# Patient Record
Sex: Male | Born: 1995 | Race: White | Hispanic: No | Marital: Single | Smoking: Never smoker
Health system: Southern US, Community
[De-identification: ages and names within clinical notes are randomized; demographics above are authoritative.]

## PROBLEM LIST (undated history)

## (undated) DIAGNOSIS — G2581 Restless legs syndrome: Secondary | ICD-10-CM

## (undated) HISTORY — DX: Restless legs syndrome: G25.81

## (undated) HISTORY — PX: HYPOSPADIAS CORRECTION: SHX483

## (undated) HISTORY — PX: CYST EXCISION: SHX5701

---
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
## 2018-12-30 ENCOUNTER — Emergency Department
Admission: EM | Admit: 2018-12-30 | Discharge: 2018-12-30 | Disposition: A | Discharge status: Home / Self Care | Payer: BLUE CROSS/BLUE SHIELD | Source: Home / Self Care | Attending: Family Medicine | Admitting: Family Medicine

## 2018-12-30 ENCOUNTER — Other Ambulatory Visit: Payer: Self-pay

## 2018-12-30 ENCOUNTER — Emergency Department (INDEPENDENT_AMBULATORY_CARE_PROVIDER_SITE_OTHER): Payer: BLUE CROSS/BLUE SHIELD

## 2018-12-30 DIAGNOSIS — K5900 Constipation, unspecified: Secondary | ICD-10-CM | POA: Diagnosis not present

## 2018-12-30 MED ORDER — POLYETHYLENE GLYCOL 3350 17 G PO PACK: 17.0000 g | PACK | Freq: Every day | ORAL | 0 refills | Status: DC

## 2018-12-30 MED ORDER — DOCUSATE SODIUM 100 MG PO CAPS: 100.0000 mg | ORAL_CAPSULE | Freq: Every day | ORAL | 0 refills | Status: DC | PRN

## 2018-12-30 NOTE — ED Triage Notes (Signed)
Pt c/o constipation x 1 wk. Took some mg on Mon night but still having issues. Also c/o sharp RT flank pain and bloating.

## 2018-12-30 NOTE — Discharge Instructions (Signed)
°  You may take the miralax and colace daily for up to 1 week until having normal bowel movements. You may then take Miralax every other day as needed to help keep stools soft. Try to stay well hydrated, drinking at least 100 ounces a day.    Please follow up with family medicine next week if not improving. Call 911 or go to the hospital if symptoms significantly worsening.

## 2018-12-30 NOTE — ED Provider Notes (Signed)
[name]    CSN: [id number] Arrival date & time: 12/30/18  1029     History   Chief Complaint Chief Complaint  Patient presents with  . Constipation  . Bloated    with pain    HPI Herbert Orozco is a 23 y.o. male.   HPI  Herbert Orozco is a 23 y.o. male presenting to UC with c/o 1 week of constipation and bloating. He did have a BM yesterday that was hard and flat after taking magnesium citrate once. Hx of constipation in the past but usually not for this long. He c/o occasional sharp pains throughout his abdomen. Denies pain at this time. Denies fever, chills, nausea or vomiting. No hx of abdominal surgeries. No new medications or supplements.   History reviewed. No pertinent past medical history.  There are no active problems to display for this patient.   Past Surgical History:  Procedure Laterality Date  . HYPOSPADIAS CORRECTION         Home Medications    Prior to Admission medications   Medication Sig Start Date End Date Taking? Authorizing Provider  ciprofloxacin (CIPRO) 500 MG tablet Take 1 tablet (500 mg total) by mouth 2 (two) times daily. 10/17/14   Gwyneth Sprout, MD  docusate sodium (COLACE) 100 MG capsule Take 1 capsule (100 mg total) by mouth daily as needed for mild constipation or moderate constipation. 12/30/18   Lurene Shadow, PA-C  HYDROcodone-acetaminophen (NORCO/VICODIN) 5-325 MG per tablet Take 1 tablet by mouth every 6 (six) hours as needed for moderate pain or severe pain. 10/17/14   Gwyneth Sprout, MD  polyethylene glycol (MIRALAX / GLYCOLAX) packet Take 17 g by mouth daily. Then every other day as needed 12/30/18   Rolla Plate    Family History History reviewed. No pertinent family history.  Social History Social History   Tobacco Use  . Smoking status: Never Smoker  . Smokeless tobacco: Never Used  Substance Use Topics  . Alcohol use: Yes    Comment: occ  . Drug use: No     Allergies     Sulfa antibiotics and Penicillins   Review of Systems Review of Systems  Constitutional: Negative for chills and fever.  Gastrointestinal: Positive for abdominal pain and constipation. Negative for diarrhea, nausea and vomiting.     Physical Exam Triage Vital Signs ED Triage Vitals  Enc Vitals Group     BP 12/30/18 1045 131/88     Pulse Rate 12/30/18 1045 (!) 102     Resp 12/30/18 1045 18     Temp 12/30/18 1045 97.7 F (36.5 C)     Temp Source 12/30/18 1045 Oral     SpO2 12/30/18 1045 97 %     Weight 12/30/18 1046 218 lb (98.9 kg)     Height 12/30/18 1046 5\' 9"  (1.753 m)     Head Circumference --      Peak Flow --      Pain Score 12/30/18 1045 4     Pain Loc --      Pain Edu? --      Excl. in GC? --    No data found.  Updated Vital Signs BP 131/88 (BP Location: Right Arm)   Pulse (!) 102   Temp 97.7 F (36.5 C) (Oral)   Resp 18   Ht 5\' 9"  (1.753 m)   Wt 218 lb (98.9 kg)   SpO2 97%   BMI 32.19 kg/m   Visual Acuity  Right Eye Distance:   Left Eye Distance:   Bilateral Distance:    Right Eye Near:   Left Eye Near:    Bilateral Near:     Physical Exam Vitals signs and nursing note reviewed.  Constitutional:      Appearance: Normal appearance. He is well-developed.  HENT:     Head: Normocephalic and atraumatic.  Neck:     Musculoskeletal: Normal range of motion.  Cardiovascular:     Rate and Rhythm: Normal rate and regular rhythm.  Pulmonary:     Effort: Pulmonary effort is normal.     Breath sounds: Normal breath sounds.  Abdominal:     General: There is no distension.     Palpations: Abdomen is soft.     Tenderness: There is no abdominal tenderness. There is no right CVA tenderness or left CVA tenderness.     Comments: Obese abdomen  Musculoskeletal: Normal range of motion.  Skin:    General: Skin is warm and dry.  Neurological:     Mental Status: He is alert and oriented to person, place, and time.  Psychiatric:        Behavior: Behavior  normal.      UC Treatments / Results  Labs (all labs ordered are listed, but only abnormal results are displayed) Labs Reviewed - No data to display  EKG None  Radiology Dg Abdomen 1 View  Result Date: 12/30/2018 CLINICAL DATA:  Constipation, bloating EXAM: ABDOMEN - 1 VIEW COMPARISON:  None. FINDINGS: The bowel gas pattern is normal. No radio-opaque calculi or other significant radiographic abnormality are seen. IMPRESSION: Negative. Electronically Signed   By: Charlett NoseKevin  Dover M.D.   On: 12/30/2018 11:27    Procedures Procedures (including critical care time)  Medications Ordered in UC Medications - No data to display  Initial Impression / Assessment and Plan / UC Course  I have reviewed the triage vital signs and the nursing notes.  Pertinent labs & imaging results that were available during my care of the patient were reviewed by me and considered in my medical decision making (see chart for details).    Benign abdominal exam Will tx constipation with colace and miralax  Home care info provided.  Final Clinical Impressions(s) / UC Diagnoses   Final diagnoses:  Constipation, unspecified constipation type     Discharge Instructions      You may take the miralax and colace daily for up to 1 week until having normal bowel movements. You may then take Miralax every other day as needed to help keep stools soft. Try to stay well hydrated, drinking at least 100 ounces a day.    Please follow up with family medicine next week if not improving. Call 911 or go to the hospital if symptoms significantly worsening.     ED Prescriptions    Medication Sig Dispense Auth. Provider   polyethylene glycol (MIRALAX / GLYCOLAX) packet Take 17 g by mouth daily. Then every other day as needed 24 each Lurene ShadowPhelps, Leiya Keesey O, PA-C   docusate sodium (COLACE) 100 MG capsule Take 1 capsule (100 mg total) by mouth daily as needed for mild constipation or moderate constipation. 14 capsule Lurene ShadowPhelps,  Miroslava Santellan O, PA-C     Controlled Substance Prescriptions [place] Controlled Substance Registry consulted? Not Applicable   Rolla Platehelps, Fredia Chittenden O, PA-C 12/30/18 1151

## 2019-02-05 ENCOUNTER — Other Ambulatory Visit: Payer: Self-pay

## 2019-02-05 ENCOUNTER — Emergency Department
Admission: EM | Admit: 2019-02-05 | Discharge: 2019-02-05 | Disposition: A | Discharge status: Home / Self Care | Payer: BLUE CROSS/BLUE SHIELD | Source: Home / Self Care | Attending: Emergency Medicine | Admitting: Emergency Medicine

## 2019-02-05 ENCOUNTER — Emergency Department (INDEPENDENT_AMBULATORY_CARE_PROVIDER_SITE_OTHER): Payer: BLUE CROSS/BLUE SHIELD

## 2019-02-05 ENCOUNTER — Encounter: Payer: Self-pay | Admitting: Emergency Medicine

## 2019-02-05 DIAGNOSIS — R05 Cough: Secondary | ICD-10-CM | POA: Diagnosis not present

## 2019-02-05 DIAGNOSIS — R0602 Shortness of breath: Secondary | ICD-10-CM | POA: Diagnosis not present

## 2019-02-05 DIAGNOSIS — R062 Wheezing: Secondary | ICD-10-CM

## 2019-02-05 DIAGNOSIS — J45990 Exercise induced bronchospasm: Secondary | ICD-10-CM | POA: Diagnosis not present

## 2019-02-05 MED ORDER — ALBUTEROL SULFATE HFA 108 (90 BASE) MCG/ACT IN AERS: 1.0000 | INHALATION_SPRAY | Freq: Four times a day (QID) | RESPIRATORY_TRACT | 0 refills | Status: AC | PRN

## 2019-02-05 MED ORDER — BECLOMETHASONE DIPROP HFA 80 MCG/ACT IN AERB: 2.0000 | INHALATION_SPRAY | Freq: Two times a day (BID) | RESPIRATORY_TRACT | Status: AC

## 2019-02-05 NOTE — Discharge Instructions (Addendum)
Do not use your Symbicort. Use Qvar inhaler twice a day. Use your albuterol inhaler 1 hour prior to exercise or as needed wheezing. Call the number on your AVS for an appointment with pulmonary.

## 2019-02-05 NOTE — ED Notes (Signed)
Peak Flo Results:  1st 400 2nd 400 3rd 400  Average = 400

## 2019-02-05 NOTE — ED Triage Notes (Signed)
The patient reported that for the last year he has had a dry non-productive cough as well as a "snoring sound and vibration in his chest." The patient also reported some SOB with exertion.

## 2019-02-05 NOTE — ED Provider Notes (Signed)
[name]    CSN: [id number] Arrival date & time: 02/05/19  1249     History   Chief Complaint Chief Complaint  Patient presents with  . Cough    HPI Herbert Orozco is a 23 y.o. male.  Patient presents with a one-year history of intermittent wheezing and gurgling sensation in the right side of his chest.  Patient states that if he exercises he has this sensation.  He does note a weight gain over the last year and is unsure whether this may have made things worse.  He does have a dog at home.  He states that sometimes if he is exposed to perfumes or odors this sometimes makes him cough.  He states it is always on the right side of his chest and he has a froglike sensation in that area.  He also states that if he is around perfumes he gets a scratchy sensation in his throat.  He was seen by his primary care provider yesterday and started on Symbicort.  He daily takes Zyrtec-D and uses Flonase.  HPI  History reviewed. No pertinent past medical history.  There are no active problems to display for this patient.   Past Surgical History:  Procedure Laterality Date  . HYPOSPADIAS CORRECTION         Home Medications    Prior to Admission medications   Medication Sig Start Date End Date Taking? Authorizing Provider  budesonide-formoterol (SYMBICORT) 160-4.5 MCG/ACT inhaler Inhale 2 puffs into the lungs 2 (two) times daily.   Yes [provider]  cetirizine-pseudoephedrine (ZYRTEC-D) 5-120 MG tablet Take 1 tablet by mouth 2 (two) times daily.   Yes [provider]  fluticasone (FLONASE) 50 MCG/ACT nasal spray Place into both nostrils daily.   Yes [provider]  albuterol (PROVENTIL HFA;VENTOLIN HFA) 108 (90 Base) MCG/ACT inhaler Inhale 1-2 puffs into the lungs every 6 (six) hours as needed for wheezing or shortness of breath. 02/05/19   Collene Gobble, MD  beclomethasone (QVAR REDIHALER) 80 MCG/ACT inhaler Inhale 2 puffs into the lungs 2  (two) times daily. 02/05/19   Collene Gobble, MD  ciprofloxacin (CIPRO) 500 MG tablet Take 1 tablet (500 mg total) by mouth 2 (two) times daily. 10/17/14   Gwyneth Sprout, MD  docusate sodium (COLACE) 100 MG capsule Take 1 capsule (100 mg total) by mouth daily as needed for mild constipation or moderate constipation. 12/30/18   Lurene Shadow, PA-C  HYDROcodone-acetaminophen (NORCO/VICODIN) 5-325 MG per tablet Take 1 tablet by mouth every 6 (six) hours as needed for moderate pain or severe pain. 10/17/14   Gwyneth Sprout, MD  polyethylene glycol (MIRALAX / GLYCOLAX) packet Take 17 g by mouth daily. Then every other day as needed 12/30/18   Rolla Plate    Family History History reviewed. No pertinent family history.  Social History Social History   Tobacco Use  . Smoking status: Never Smoker  . Smokeless tobacco: Never Used  Substance Use Topics  . Alcohol use: Yes    Comment: occ  . Drug use: No     Allergies   Sulfa antibiotics and Penicillins   Review of Systems Review of Systems  Constitutional: Positive for unexpected weight change.  HENT: Positive for congestion and sore throat.   Eyes: Negative.   Respiratory: Positive for cough, shortness of breath and wheezing.   Cardiovascular: Negative.   Gastrointestinal: Negative.   Skin: Negative.   Psychiatric/Behavioral: Negative.  Physical Exam Triage Vital Signs ED Triage Vitals  Enc Vitals Group     BP 02/05/19 1305 126/81     Pulse Rate 02/05/19 1305 (!) 102     Resp 02/05/19 1305 18     Temp 02/05/19 1305 98.3 F (36.8 C)     Temp Source 02/05/19 1305 Oral     SpO2 02/05/19 1305 99 %     Weight 02/05/19 1306 220 lb (99.8 kg)     Height 02/05/19 1306 5\' 9"  (1.753 m)     Head Circumference --      Peak Flow --      Pain Score 02/05/19 1306 0     Pain Loc --      Pain Edu? --      Excl. in GC? --    No data found.  Updated Vital Signs BP 126/81 (BP Location: Right Arm)   Pulse (!) 102    Temp 98.3 F (36.8 C) (Oral)   Resp 18   Ht 5\' 9"  (1.753 m)   Wt 99.8 kg   SpO2 99%   BMI 32.49 kg/m   Visual Acuity Right Eye Distance:   Left Eye Distance:   Bilateral Distance:    Right Eye Near:   Left Eye Near:    Bilateral Near:     Physical Exam Constitutional:      Appearance: Normal appearance.  HENT:     Right Ear: Tympanic membrane normal.     Left Ear: Tympanic membrane normal.     Nose: Nose normal.     Mouth/Throat:     Mouth: Mucous membranes are dry.     Pharynx: Oropharynx is clear.  Eyes:     Pupils: Pupils are equal, round, and reactive to light.  Neck:     Musculoskeletal: Normal range of motion. No muscular tenderness.  Cardiovascular:     Rate and Rhythm: Normal rate and regular rhythm.     Heart sounds: No murmur.  Pulmonary:     Effort: Pulmonary effort is normal.     Breath sounds: Normal breath sounds. No wheezing.  Abdominal:     General: Abdomen is flat.     Palpations: Abdomen is soft.  Neurological:     Mental Status: He is alert.      UC Treatments / Results  Labs (all labs ordered are listed, but only abnormal results are displayed) Labs Reviewed - No data to display  EKG None  Radiology Dg Chest 2 View  Result Date: 02/05/2019 CLINICAL DATA:  Productive cough with shortness of breath. EXAM: CHEST - 2 VIEW COMPARISON:  10/25/2008 FINDINGS: The lungs are clear without focal pneumonia, edema, pneumothorax or pleural effusion. The cardiopericardial silhouette is within normal limits for size. The visualized bony structures of the thorax are intact. IMPRESSION: Normal exam. Electronically Signed   By: Kennith CenterEric  Mansell M.D.   On: 02/05/2019 13:53    Procedures Procedures (including critical care time)  Medications Ordered in UC Medications - No data to display  Initial Impression / Assessment and Plan / UC Course  I have reviewed the triage vital signs and the nursing notes.  Pertinent labs & imaging results that were  available during my care of the patient were reviewed by me and considered in my medical decision making (see chart for details). Peak flow was 595 predicted.  His peak flow we measured in office was 400.  He does have symptoms suggestive of exercise-induced asthma as well as sensitivity to  environmental exposures.  Symptoms have been present for 1 year or so we will check a chest x-ray.  I do feel like he has some type of reactive airways disease which has been made worse with his recent weight gain.  His girlfriend says he snores at night but does not have any sleep apnea.  Chest x-ray per radiology is normal.  I suspect the patient has exercise-induced asthma with some component of environmental allergies.  I have placed a referral to pulmonary and he will call for an appointment to get a definite diagnosis.  I think it would be wise to stop his Symbicort and put him on a steroid inhaler only and have an albuterol rescue inhaler to do for symptoms or prior to exercise.    He just awaited Final Clinical Impressions(s) / UC Diagnoses   Final diagnoses:  Wheezing  Mild exercise-induced asthma     Discharge Instructions     Do not use your Symbicort. Use Qvar inhaler twice a day. Use your albuterol inhaler 1 hour prior to exercise or as needed wheezing. Call the number on your AVS for an appointment with pulmonary.    ED Prescriptions    Medication Sig Dispense Auth. Provider   albuterol (PROVENTIL HFA;VENTOLIN HFA) 108 (90 Base) MCG/ACT inhaler Inhale 1-2 puffs into the lungs every 6 (six) hours as needed for wheezing or shortness of breath. 1 Inhaler Collene Gobble, MD   beclomethasone (QVAR REDIHALER) 80 MCG/ACT inhaler Inhale 2 puffs into the lungs 2 (two) times daily. 1 Inhaler Collene Gobble, MD     Controlled Substance Prescriptions [place] Controlled Substance Registry consulted? Not Applicable   Collene Gobble, MD 02/05/19 1414

## 2019-02-08 ENCOUNTER — Ambulatory Visit: Payer: BLUE CROSS/BLUE SHIELD | Admitting: Pulmonary Disease

## 2019-02-08 ENCOUNTER — Encounter: Payer: Self-pay | Admitting: Pulmonary Disease

## 2019-02-08 VITALS — BP 122/72 | HR 96 | Ht 69.0 in | Wt 219.2 lb

## 2019-02-08 DIAGNOSIS — R05 Cough: Secondary | ICD-10-CM

## 2019-02-08 DIAGNOSIS — R059 Cough, unspecified: Secondary | ICD-10-CM

## 2019-02-08 LAB — CBC WITH DIFFERENTIAL/PLATELET
BASOS ABS: 0 10*3/uL (ref 0.0–0.1)
Basophils Relative: 0.5 % (ref 0.0–3.0)
EOS ABS: 0.1 10*3/uL (ref 0.0–0.7)
Eosinophils Relative: 1 % (ref 0.0–5.0)
HCT: 49.5 % (ref 39.0–52.0)
HEMOGLOBIN: 17.2 g/dL — AB (ref 13.0–17.0)
LYMPHS PCT: 23.2 % (ref 12.0–46.0)
Lymphs Abs: 2.1 10*3/uL (ref 0.7–4.0)
MCHC: 34.8 g/dL (ref 30.0–36.0)
MCV: 88.9 fl (ref 78.0–100.0)
MONO ABS: 0.8 10*3/uL (ref 0.1–1.0)
Monocytes Relative: 9.1 % (ref 3.0–12.0)
Neutro Abs: 5.9 10*3/uL (ref 1.4–7.7)
Neutrophils Relative %: 66.2 % (ref 43.0–77.0)
Platelets: 334 10*3/uL (ref 150.0–400.0)
RBC: 5.57 Mil/uL (ref 4.22–5.81)
RDW: 12.6 % (ref 11.5–15.5)
WBC: 8.9 10*3/uL (ref 4.0–10.5)

## 2019-02-08 LAB — NITRIC OXIDE: NITRIC OXIDE: 7

## 2019-02-08 NOTE — Patient Instructions (Addendum)
We will get some labs today including CBC differential, ige We will schedule you for pulmonary function test and follow-up in 2 to 4 weeks Continue using the Qvar and albuterol inhaler

## 2019-02-08 NOTE — Progress Notes (Addendum)
Herbert Orozco    [id number]    02-Aug-1996  Primary Care Physician:Coletta, Fenton Malling, MD  Referring Physician: Raynelle Jan, MD [address] [place] [address] [place], [place] [address]  Chief complaint: Consult for asthma  HPI: 23 year old with history of restless leg syndrome.  Complains of dyspnea with exercise, wheezing for the past 1 year.  Is associated with nonproductive cough.  Occasionally has clear mucus production. He had been given a Symbicort inhaler from his primary care but had not used it.  Evaluated in the ED on 2/20 and told to use Qvar and albuterol instead.  He feels that his breathing is slightly better with that.  He had a chest x-ray in the ED which did not show any acute abnormality.  Notes history of chronic allergies with postnasal drip.  He is using Zyrtec daily and Flonase over-the-counter.  He also has occasional GERD and is not on treatment for this.  Pets: Has a dog, no cats, birds, farm animals Occupation: Works at the [employer] Exposures: No known exposures, no mold, hot tub, Jacuzzi Smoking history: Never smoker, does not vape Travel history: From [place].  No significant travel history Relevant family history: Grandmother and mom had asthma. ACQ6 02/08/19- 1.83  Outpatient Encounter Medications as of 02/08/2019  Medication Sig  . albuterol (PROVENTIL HFA;VENTOLIN HFA) 108 (90 Base) MCG/ACT inhaler Inhale 1-2 puffs into the lungs every 6 (six) hours as needed for wheezing or shortness of breath.  . beclomethasone (QVAR REDIHALER) 80 MCG/ACT inhaler Inhale 2 puffs into the lungs 2 (two) times daily.  . cetirizine-pseudoephedrine (ZYRTEC-D) 5-120 MG tablet Take 1 tablet by mouth 2 (two) times daily.  . fluticasone (FLONASE) 50 MCG/ACT nasal spray Place into both nostrils daily.  Marland Kitchen HYDROcodone-acetaminophen (NORCO/VICODIN) 5-325 MG per tablet Take 1 tablet by mouth every 6 (six) hours as needed for moderate pain or  severe pain.  . polyethylene glycol (MIRALAX / GLYCOLAX) packet Take 17 g by mouth daily. Then every other day as needed  . [DISCONTINUED] budesonide-formoterol (SYMBICORT) 160-4.5 MCG/ACT inhaler Inhale 2 puffs into the lungs 2 (two) times daily.  . [DISCONTINUED] ciprofloxacin (CIPRO) 500 MG tablet Take 1 tablet (500 mg total) by mouth 2 (two) times daily.  . [DISCONTINUED] docusate sodium (COLACE) 100 MG capsule Take 1 capsule (100 mg total) by mouth daily as needed for mild constipation or moderate constipation.   No facility-administered encounter medications on file as of 02/08/2019.     Allergies as of 02/08/2019 - Review Complete 02/08/2019  Allergen Reaction Noted  . Sulfa antibiotics  12/30/2018  . Penicillins Rash 10/17/2014    Past Medical History:  Diagnosis Date  . Restless leg syndrome     Past Surgical History:  Procedure Laterality Date  . HYPOSPADIAS CORRECTION      Family History  Problem Relation Age of Onset  . Hypertension Father   . Asthma Maternal Grandmother   . Depression Maternal Grandmother   . Crohn's disease Maternal Grandfather   . Hypertension Paternal Grandfather     Social History   Socioeconomic History  . Marital status: Single    Spouse name: Not on file  . Number of children: Not on file  . Years of education: Not on file  . Highest education level: Not on file  Occupational History  . Not on file  Social Needs  . Financial resource strain: Not on file  . Food insecurity:  Worry: Not on file    Inability: Not on file  . Transportation needs:    Medical: Not on file    Non-medical: Not on file  Tobacco Use  . Smoking status: Never Smoker  . Smokeless tobacco: Never Used  Substance and Sexual Activity  . Alcohol use: Yes    Comment: occ  . Drug use: No  . Sexual activity: Not on file  Lifestyle  . Physical activity:    Days per week: Not on file    Minutes per session: Not on file  . Stress: Not on file    Relationships  . Social connections:    Talks on phone: Not on file    Gets together: Not on file    Attends religious service: Not on file    Active member of club or organization: Not on file    Attends meetings of clubs or organizations: Not on file    Relationship status: Not on file  . Intimate partner violence:    Fear of current or ex partner: Not on file    Emotionally abused: Not on file    Physically abused: Not on file    Forced sexual activity: Not on file  Other Topics Concern  . Not on file  Social History Narrative  . Not on file    Review of systems: Review of Systems  Constitutional: Negative for fever and chills.  HENT: Negative.   Eyes: Negative for blurred vision.  Respiratory: as per HPI  Cardiovascular: Negative for chest pain and palpitations.  Gastrointestinal: Negative for vomiting, diarrhea, blood per rectum. Genitourinary: Negative for dysuria, urgency, frequency and hematuria.  Musculoskeletal: Negative for myalgias, back pain and joint pain.  Skin: Negative for itching and rash.  Neurological: Negative for dizziness, tremors, focal weakness, seizures and loss of consciousness.  Endo/Heme/Allergies: Negative for environmental allergies.  Psychiatric/Behavioral: Negative for depression, suicidal ideas and hallucinations.  All other systems reviewed and are negative.  Physical Exam: Blood pressure 122/72, pulse 96, height 5\' 9"  (1.753 m), weight 219 lb 3.2 oz (99.4 kg), SpO2 96 %. Gen:      No acute distress HEENT:  EOMI, sclera anicteric Neck:     No masses; no thyromegaly Lungs:    Clear to auscultation bilaterally; normal respiratory effort CV:         Regular rate and rhythm; no murmurs Abd:      + bowel sounds; soft, non-tender; no palpable masses, no distension Ext:    No edema; adequate peripheral perfusion Skin:      Warm and dry; no rash Neuro: alert and oriented x 3 Psych: normal mood and affect  Data Reviewed: Imaging: Chest  x-ray 02/05/2019- no acute cardiopulmonary abnormality.  PFTs:  FENO 02/08/2019-7  Assessment:  Consult for asthma Have mild reactive airway disease, exercise-induced asthma. We will evaluate with a CBC with differential, blood allergy profile and pulmonary function test Based on these results we may consider cardiopulmonary exercise test Continue Qvar and albuterol for now  Plan/Recommendations: - CBC, allergy profile - PFTs - Continue Qvar and albuterol  Chilton Greathouse MD [facility] 02/08/2019, 10:22 AM  CC: Raynelle Jan, MD

## 2019-02-09 LAB — IGE: IGE (IMMUNOGLOBULIN E), SERUM: 17 kU/L (ref <=114)

## 2019-02-21 ENCOUNTER — Telehealth: Payer: Self-pay | Admitting: Pulmonary Disease

## 2019-02-21 NOTE — Telephone Encounter (Signed)
Notes recorded by Chilton Greathouse, MD on 02/21/2019 at 2:32 PM EDT Labs are normal ___________________________  Jeanene Erb and spoke with patient he is aware of results and verbalized understanding. Nothing further needed.

## 2019-03-11 ENCOUNTER — Ambulatory Visit: Payer: BLUE CROSS/BLUE SHIELD | Admitting: Pulmonary Disease

## 2019-06-15 ENCOUNTER — Encounter (HOSPITAL_COMMUNITY): Payer: Self-pay | Admitting: Behavioral Health

## 2019-06-15 ENCOUNTER — Ambulatory Visit (HOSPITAL_COMMUNITY)
Admission: RE | Admit: 2019-06-15 | Discharge: 2019-06-15 | Disposition: A | Discharge status: Home / Self Care | Payer: BC Managed Care – PPO | Attending: Psychiatry | Admitting: Psychiatry

## 2019-06-15 DIAGNOSIS — Z882 Allergy status to sulfonamides status: Secondary | ICD-10-CM | POA: Diagnosis not present

## 2019-06-15 DIAGNOSIS — Z880 Allergy status to penicillin: Secondary | ICD-10-CM | POA: Insufficient documentation

## 2019-06-15 DIAGNOSIS — Z7289 Other problems related to lifestyle: Secondary | ICD-10-CM | POA: Diagnosis not present

## 2019-06-15 DIAGNOSIS — F422 Mixed obsessional thoughts and acts: Secondary | ICD-10-CM | POA: Insufficient documentation

## 2019-06-15 NOTE — H&P (Signed)
Behavioral Health Medical Screening Exam  Herbert Orozco is an 23 y.o. male.who presented as a voluntary walk-in accompanied alone. Patient denies active or passive suicidal ideations, homicidal thoughts or psychotic symptoms. He reports a history of OCD, depression and anxiety and endorses his ongoing OCD has caused a increase in anxiety and depressive symptoms. He is currently receiving outpatient psychiatric services  with [facility]. He is on medication for depression anxiety and OCD. In regards to OCD, he endorses intrusive thoughts. He has no history of suicide attempts or cutting behaviors but does have a history of suicidal thoughts which he reports was in the distance past.He reports he was hospitalized at [facility] 3 years ago for suicidal thoughts. He states he is looking for additional outpatient psychiatric or therapy for better coping skills to mange his OCD.    Total Time spent with patient: 15 minutes  Psychiatric Specialty Exam: Physical Exam  Nursing note and vitals reviewed. Constitutional: He is oriented to person, place, and time.  Neurological: He is alert and oriented to person, place, and time.    Review of Systems  Psychiatric/Behavioral: Negative for depression, hallucinations, memory loss, substance abuse and suicidal ideas. The patient is nervous/anxious. The patient does not have insomnia.   All other systems reviewed and are negative.   There were no vitals taken for this visit.There is no height or weight on file to calculate BMI.  General Appearance: Fairly Groomed  Eye Contact:  Fair  Speech:  Clear and Coherent and Normal Rate  Volume:  Normal  Mood:  Anxious  Affect:  Congruent  Thought Process:  Coherent, Goal Directed, Linear and Descriptions of Associations: Intact  Orientation:  Full (Time, Place, and Person)  Thought Content:  WDL  Suicidal Thoughts:  No  Homicidal Thoughts:  No  Memory:  Immediate;   Fair Recent;   Fair   Judgement:  Fair  Insight:  Fair  Psychomotor Activity:  Normal  Concentration: Concentration: Fair and Attention Span: Fair  Recall:  [employer] of Knowledge:Fair  Language: Good  Akathisia:  Negative  Handed:  Right  AIMS (if indicated):     Assets:  Communication Skills Desire for Improvement Financial Resources/Insurance Resilience  Sleep:       Musculoskeletal: Strength & Muscle Tone: within normal limits Gait & Station: normal Patient leans: N/A  There were no vitals taken for this visit.  Recommendations:  Based on my evaluation the patient does not appear to have an emergency medical condition.   No evidence of imminent risk to self or others at present.   Patient does not meet criteria for psychiatric inpatient admission. Reccommended to continue follow-up with his outpatient team. additional resources were provided for outpatient psychiatry/therpay.      Herbert Maes, NP 06/15/2019, 3:30 PM

## 2019-06-15 NOTE — BH Assessment (Signed)
Assessment Note  Herbert Orozco is a 23 y.o. male who presented to [facility] as a voluntary walk-in with complaint of obsessive intrusions (OCD).  He works full-time at [employer], and he lives with his parents in [place].  Pt is followed by Dr. Antionette Char for treatment of OCD.  Pt stated that he has OCD -- intrusions are religious and relational in nature -- and his anxiety becomes severe enough that he becomes depressed.  Pt reported that he feels very anxious, sleeps 12 + hours per day, and cannot stop obsessing over matters like his job security, whether he is saved, and whether his girlfriend is faithful to him.  Pt began taking Prozac this week.  He is not currently involved in therapy.  Pt came to [facility] to inquire about outpatient treatment. Pt denied current suicidal ideation, homicidal ideation, self-injurious behavior, and hallucination.  He denied substance use concerns.  During assessment, Pt presented as alert and oriented.  He was dressed in street clothes, and he appeared appropriately groomed.  Pt's mood and affect were anxious.  Pt's speech was normal in rate, rhythm, volume.  Thought processes were within normal range.  Thought content was consistent with obsessive thoughts.  Pt's memory and concentration were intact.  Insight, judgment, and impulse control were fair.  Consulted with L. Marcello Moores, FNP, who also met with Pt.  Pt discharged with outpatient resources.  Diagnosis: F42.2  Past Medical History:  Past Medical History:  Diagnosis Date  . Restless leg syndrome     Past Surgical History:  Procedure Laterality Date  . HYPOSPADIAS CORRECTION      Family History:  Family History  Problem Relation Age of Onset  . Hypertension Father   . Asthma Maternal Grandmother   . Depression Maternal Grandmother   . Crohn's disease Maternal Grandfather   . Hypertension Paternal Grandfather     Social History:  reports that he has never smoked. He has never used smokeless  tobacco. He reports current alcohol use. He reports that he does not use drugs.  Additional Social History:  Alcohol / Drug Use Pain Medications: See MAR Prescriptions: See MAR Over the Counter: See MAR History of alcohol / drug use?: No history of alcohol / drug abuse  CIWA:   COWS:    Allergies:  Allergies  Allergen Reactions  . Sulfa Antibiotics   . Penicillins Rash    Home Medications: (Not in a hospital admission)   OB/GYN Status:  No LMP for male patient.  General Assessment Data [facility] of Assessment: [facility] Services TTS Assessment: In system Is this a Tele or Face-to-Face Assessment?: Face-to-Face Is this an Initial Assessment or a Re-assessment for this encounter?: Initial Assessment Patient Accompanied by:: N/A Language Other than English: No Living Arrangements: Other (Comment) What gender do you identify as?: Male Marital status: Single Pregnancy Status: No Living Arrangements: Parent Can pt return to current living arrangement?: Yes Admission Status: Voluntary Is patient capable of signing voluntary admission?: Yes Referral Source: Self/Family/Friend Insurance type: [place] Screening Exam ([place]) Medical Exam completed: Yes  Crisis Care Plan Living Arrangements: Parent Name of Psychiatrist: Jearld Fenton Name of Therapist: None  Education Status Is patient currently in school?: No Is the patient employed, unemployed or receiving disability?: Employed  Risk to self with the past 6 months Suicidal Ideation: No(Endorsed passive ideation) Has patient been a risk to self within the past 6 months prior to admission? : No Suicidal Intent: No Has patient had  any suicidal intent within the past 6 months prior to admission? : No Is patient at risk for suicide?: No Suicidal Plan?: No Has patient had any suicidal plan within the past 6 months prior to admission? : No Access to Means: No What has been your use of drugs/alcohol  within the last 12 months?: (Denied) Previous Attempts/Gestures: Yes How many times?: 1 Triggers for Past Attempts: Other (Comment)(OCD) Intentional Self Injurious Behavior: None Family Suicide History: Unknown Recent stressful life event(s): Other (Comment)(Increased OCD) Persecutory voices/beliefs?: No Depression: Yes Depression Symptoms: Despondent(Hypersomnia) Substance abuse history and/or treatment for substance abuse?: No Suicide prevention information given to non-admitted patients: Not applicable  Risk to Others within the past 6 months Homicidal Ideation: No Does patient have any lifetime risk of violence toward others beyond the six months prior to admission? : No Thoughts of Harm to Others: No Current Homicidal Intent: No Current Homicidal Plan: No Access to Homicidal Means: No History of harm to others?: No Assessment of Violence: None Noted Does patient have access to weapons?: No Criminal Charges Pending?: No Does patient have a court date: No Is patient on probation?: No  Psychosis Hallucinations: None noted Delusions: None noted  Mental Status Report Appearance/Hygiene: Unremarkable, Other (Comment)(Street clothes) Eye Contact: Good Motor Activity: Freedom of movement, Unremarkable Speech: Logical/coherent Level of Consciousness: Alert Mood: Anxious Affect: Anxious Anxiety Level: Severe Thought Processes: Relevant, Coherent Judgement: Partial Orientation: Person, Place, Time, Situation Obsessive Compulsive Thoughts/Behaviors: None  Cognitive Functioning Concentration: Normal Memory: Recent Intact, Remote Intact Is patient IDD: No Insight: Fair Impulse Control: Good Appetite: Good Have you had any weight changes? : No Change Sleep: Increased Total Hours of Sleep: 12 Vegetative Symptoms: Staying in bed  ADLScreening [facility] Assessment Services) Patient's cognitive ability adequate to safely complete daily activities?: Yes Patient able to express  need for assistance with ADLs?: Yes Independently performs ADLs?: Yes (appropriate for developmental age)  Prior Inpatient Therapy Prior Inpatient Therapy: Yes Prior Therapy Dates: 2016 Prior Therapy Facilty/Provider(s): [facility] Reason for Treatment: SI  Prior Outpatient Therapy Prior Outpatient Therapy: Yes Prior Therapy Dates: Ongoing Prior Therapy Facilty/Provider(s): [facility] Reason for Treatment: OCD Does patient have an ACCT team?: No Does patient have Intensive In-House Services?  : No Does patient have [facility] services? : No Does patient have P4CC services?: No  ADL Screening (condition at time of admission) Patient's cognitive ability adequate to safely complete daily activities?: Yes Is the patient deaf or have difficulty hearing?: No Does the patient have difficulty seeing, even when wearing glasses/contacts?: No Does the patient have difficulty concentrating, remembering, or making decisions?: No Patient able to express need for assistance with ADLs?: Yes Does the patient have difficulty dressing or bathing?: No Independently performs ADLs?: Yes (appropriate for developmental age) Does the patient have difficulty walking or climbing stairs?: No Weakness of Legs: None Weakness of Arms/Hands: None  Home Assistive Devices/Equipment Home Assistive Devices/Equipment: None  Therapy Consults (therapy consults require a physician order) PT Evaluation Needed: No OT Evalulation Needed: No SLP Evaluation Needed: No Abuse/Neglect Assessment (Assessment to be complete while patient is alone) Abuse/Neglect Assessment Can Be Completed: Yes Physical Abuse: Denies Verbal Abuse: Denies Sexual Abuse: Denies Exploitation of patient/patient's resources: Denies Self-Neglect: Denies Values / Beliefs Cultural Requests During Hospitalization: None Spiritual Requests During Hospitalization: None Consults Spiritual Care Consult Needed: No Social Work Consult Needed: No Armed forces training and education officer (For Healthcare) Does Patient Have a Medical Advance Directive?: No          Disposition:  Disposition  Initial Assessment Completed for this Encounter: Yes Disposition of Patient: Discharge(Per L. Marcello Moores, FNP, Pt does not meet inpt criteria)  On Site Evaluation by:   Reviewed with Physician:    Laurena Slimmer Consandra Laske 06/15/2019 3:50 PM

## 2019-07-07 ENCOUNTER — Emergency Department (INDEPENDENT_AMBULATORY_CARE_PROVIDER_SITE_OTHER)
Admission: EM | Admit: 2019-07-07 | Discharge: 2019-07-07 | Disposition: A | Discharge status: Home / Self Care | Payer: BC Managed Care – PPO | Source: Home / Self Care | Attending: Family Medicine | Admitting: Family Medicine

## 2019-07-07 ENCOUNTER — Other Ambulatory Visit: Payer: Self-pay

## 2019-07-07 DIAGNOSIS — R1013 Epigastric pain: Secondary | ICD-10-CM

## 2019-07-07 MED ORDER — ESOMEPRAZOLE MAGNESIUM 40 MG PO CPDR: DELAYED_RELEASE_CAPSULE | ORAL | 1 refills | Status: AC

## 2019-07-07 MED ORDER — FAMOTIDINE 20 MG PO TABS: ORAL_TABLET | ORAL | 1 refills | Status: AC

## 2019-07-07 NOTE — Discharge Instructions (Signed)
If symptoms become significantly worse during the night or over the weekend, proceed to the local emergency room.  

## 2019-07-07 NOTE — ED Provider Notes (Signed)
[facility]    CSN: [id number] Arrival date & time: 07/07/19  1607     History   Chief Complaint Chief Complaint  Patient presents with  . Abdominal Pain    HPI Herbert Orozco is a 23 y.o. male.   Patient developed epigastric sharp stabbing pain this morning after drinking a Pepsi to swallow his morning medications.  Since then he has had persistent burning sensation in his stomach and pain swallowing fluids.  The pain does not radiate and he denies nausea/vomiting.  He has had no changes in his bowel movements.  He has a long history of GERD.  During the past 3 to 4 years he has had similar milder episodes of pain every several months.  His PCP had prescribed Nexium for about 2 weeks in the past with resolution of his pain.  He states that he frequently takes Tums with mild improvement.  During the past several days he has had increased "burping."  He often awakens in the morning with a cough and phlegm in his throat.  His symptoms are not worse with certain foods.  The history is provided by the patient.  Abdominal Pain Pain location:  Epigastric Pain quality: aching, burning and sharp   Pain radiates to:  Does not radiate Pain severity:  Severe Onset quality:  Sudden Duration:  10 hours Timing:  Constant Chronicity:  Recurrent Context: eating   Context: not alcohol use, not awakening from sleep, not diet changes, not laxative use, not medication withdrawal, not previous surgeries, not recent illness and not suspicious food intake   Relieved by:  Nothing Worsened by:  Eating Ineffective treatments:  None tried Associated symptoms: belching and chest pain   Associated symptoms: no anorexia, no chills, no constipation, no cough, no diarrhea, no dysuria, no fatigue, no fever, no flatus, no hematemesis, no hematochezia, no melena, no nausea, no sore throat and no vomiting     Past Medical History:  Diagnosis Date  . Restless leg syndrome     Active  problems:  Depression, asthma, GERD   Past Surgical History:  Procedure Laterality Date  . HYPOSPADIAS CORRECTION         Home Medications    Prior to Admission medications   Medication Sig Start Date End Date Taking? Authorizing Provider  albuterol (PROVENTIL HFA;VENTOLIN HFA) 108 (90 Base) MCG/ACT inhaler Inhale 1-2 puffs into the lungs every 6 (six) hours as needed for wheezing or shortness of breath. 02/05/19   Collene Gobbleaub, Steven A, MD  beclomethasone (QVAR REDIHALER) 80 MCG/ACT inhaler Inhale 2 puffs into the lungs 2 (two) times daily. 02/05/19   Collene Gobbleaub, Steven A, MD  busPIRone (BUSPAR) 15 MG tablet  06/20/19   [provider]  cetirizine-pseudoephedrine (ZYRTEC-D) 5-120 MG tablet Take 1 tablet by mouth 2 (two) times daily.    [provider]  escitalopram (LEXAPRO) 10 MG tablet TK 1 T PO QD 06/20/19   [provider]  esomeprazole (NEXIUM) 40 MG capsule Take one cap PO daily, 45 minutes before a meal 07/07/19   Lattie HawBeese, Stephen A, MD  famotidine (PEPCID) 20 MG tablet Take one tab PO daily at bedtime 07/07/19   Lattie HawBeese, Stephen A, MD  fluticasone (FLONASE) 50 MCG/ACT nasal spray Place into both nostrils daily.    [provider]  HYDROcodone-acetaminophen (NORCO/VICODIN) 5-325 MG per tablet Take 1 tablet by mouth every 6 (six) hours as needed for moderate pain or severe pain. 10/17/14   Gwyneth SproutPlunkett, Whitney, MD  polyethylene glycol (  MIRALAX / GLYCOLAX) packet Take 17 g by mouth daily. Then every other day as needed 12/30/18   Noe Gens, PA-C    Family History Family History  Problem Relation Age of Onset  . Hypertension Father   . Asthma Maternal Grandmother   . Depression Maternal Grandmother   . Crohn's disease Maternal Grandfather   . Hypertension Paternal Grandfather     Social History Social History   Tobacco Use  . Smoking status: Never Smoker  . Smokeless tobacco: Never Used  Substance Use Topics  . Alcohol use: Yes    Comment: occ  . Drug  use: No     Allergies   Sulfa antibiotics and Penicillins   Review of Systems Review of Systems  Constitutional: Negative for chills, fatigue and fever.  HENT: Negative for sore throat.   Respiratory: Negative for cough.   Cardiovascular: Positive for chest pain.  Gastrointestinal: Positive for abdominal pain. Negative for anorexia, constipation, diarrhea, flatus, hematemesis, hematochezia, melena, nausea and vomiting.  Genitourinary: Negative for dysuria.  All other systems reviewed and are negative.    Physical Exam Triage Vital Signs ED Triage Vitals  Enc Vitals Group     BP 07/07/19 1617 140/89     Pulse Rate 07/07/19 1617 84     Resp 07/07/19 1617 20     Temp 07/07/19 1617 98.6 F (37 C)     Temp Source 07/07/19 1617 Oral     SpO2 07/07/19 1617 98 %     Weight 07/07/19 1618 218 lb 4.1 oz (99 kg)     Height 07/07/19 1618 5\' 9"  (1.753 m)     Head Circumference --      Peak Flow --      Pain Score 07/07/19 1618 8     Pain Loc --      Pain Edu? --      Excl. in [place]? --    No data found.  Updated Vital Signs BP 140/89 (BP Location: Right Arm)   Pulse 84   Temp 98.6 F (37 C) (Oral)   Resp 20   Ht 5\' 9"  (1.753 m)   Wt 99 kg   SpO2 98%   BMI 32.23 kg/m   Visual Acuity Right Eye Distance:   Left Eye Distance:   Bilateral Distance:    Right Eye Near:   Left Eye Near:    Bilateral Near:     Physical Exam Vitals signs reviewed.  Constitutional:      General: He is not in acute distress.    Appearance: He is obese.  HENT:     Head: Normocephalic.     Mouth/Throat:     Pharynx: Oropharynx is clear.  Eyes:     Extraocular Movements: Extraocular movements intact.  Neck:     Musculoskeletal: Neck supple.  Cardiovascular:     Rate and Rhythm: Normal rate.     Heart sounds: Normal heart sounds.  Pulmonary:     Breath sounds: Normal breath sounds.  Abdominal:     General: Abdomen is flat. Bowel sounds are normal. There is no distension.      Palpations: Abdomen is soft. There is no hepatomegaly or splenomegaly.     Tenderness: There is no right CVA tenderness, left CVA tenderness or guarding. Negative signs include Murphy's sign and McBurney's sign.       Comments: There is distinct sub-xiphoid epigastric tenderness to palpation as noted on diagram.   Musculoskeletal:     Right lower  leg: No edema.     Left lower leg: No edema.  Lymphadenopathy:     Cervical: No cervical adenopathy.  Skin:    General: Skin is warm and dry.     Findings: No rash.  Neurological:     Mental Status: He is alert.      UC Treatments / Results  Labs (all labs ordered are listed, but only abnormal results are displayed) Labs Reviewed - No data to display  EKG   Radiology No results found.  Procedures Procedures (including critical care time)  Medications Ordered in UC Medications - No data to display  Initial Impression / Assessment and Plan / UC Course  I have reviewed the triage vital signs and the nursing notes.  Pertinent labs & imaging results that were available during my care of the patient were reviewed by me and considered in my medical decision making (see chart for details).    Suspect esophagitis and/or gastric/peptic ulcer.  Pancreatitis also a possibility. Patient declines testing (CMP, lipase, amylase, CBC). Will begin Nexium 40mg  QAM and Pepcid 20mg  QHS. Recommend follow-up with PCP within one week.  Recommend GI referral.   Final Clinical Impressions(s) / UC Diagnoses   Final diagnoses:  Abdominal pain, epigastric     Discharge Instructions     If symptoms become significantly worse during the night or over the weekend, proceed to the local emergency room.     ED Prescriptions    Medication Sig Dispense Auth. Provider   esomeprazole (NEXIUM) 40 MG capsule Take one cap PO daily, 45 minutes before a meal 30 capsule Lattie HawBeese, Stephen A, MD   famotidine (PEPCID) 20 MG tablet Take one tab PO daily at  bedtime 30 tablet Lattie HawBeese, Stephen A, MD         Lattie HawBeese, Stephen A, MD 07/07/19 1710

## 2019-07-07 NOTE — ED Triage Notes (Signed)
Pt c/o dull, burning stabbing pain in his upper adb area that started this morning. Mentions similar episodes in past but never this bad. Has taken nexium in past but no OTC meds tried this time. Pain 8/10

## 2019-12-10 IMAGING — DX DG ABDOMEN 1V
2 series · 2 of 2 positions shown · non-contrast
Comparison: None.

CLINICAL DATA: Constipation, bloating

EXAM:
ABDOMEN - 1 VIEW

[abdomen kub (1 of 2)]
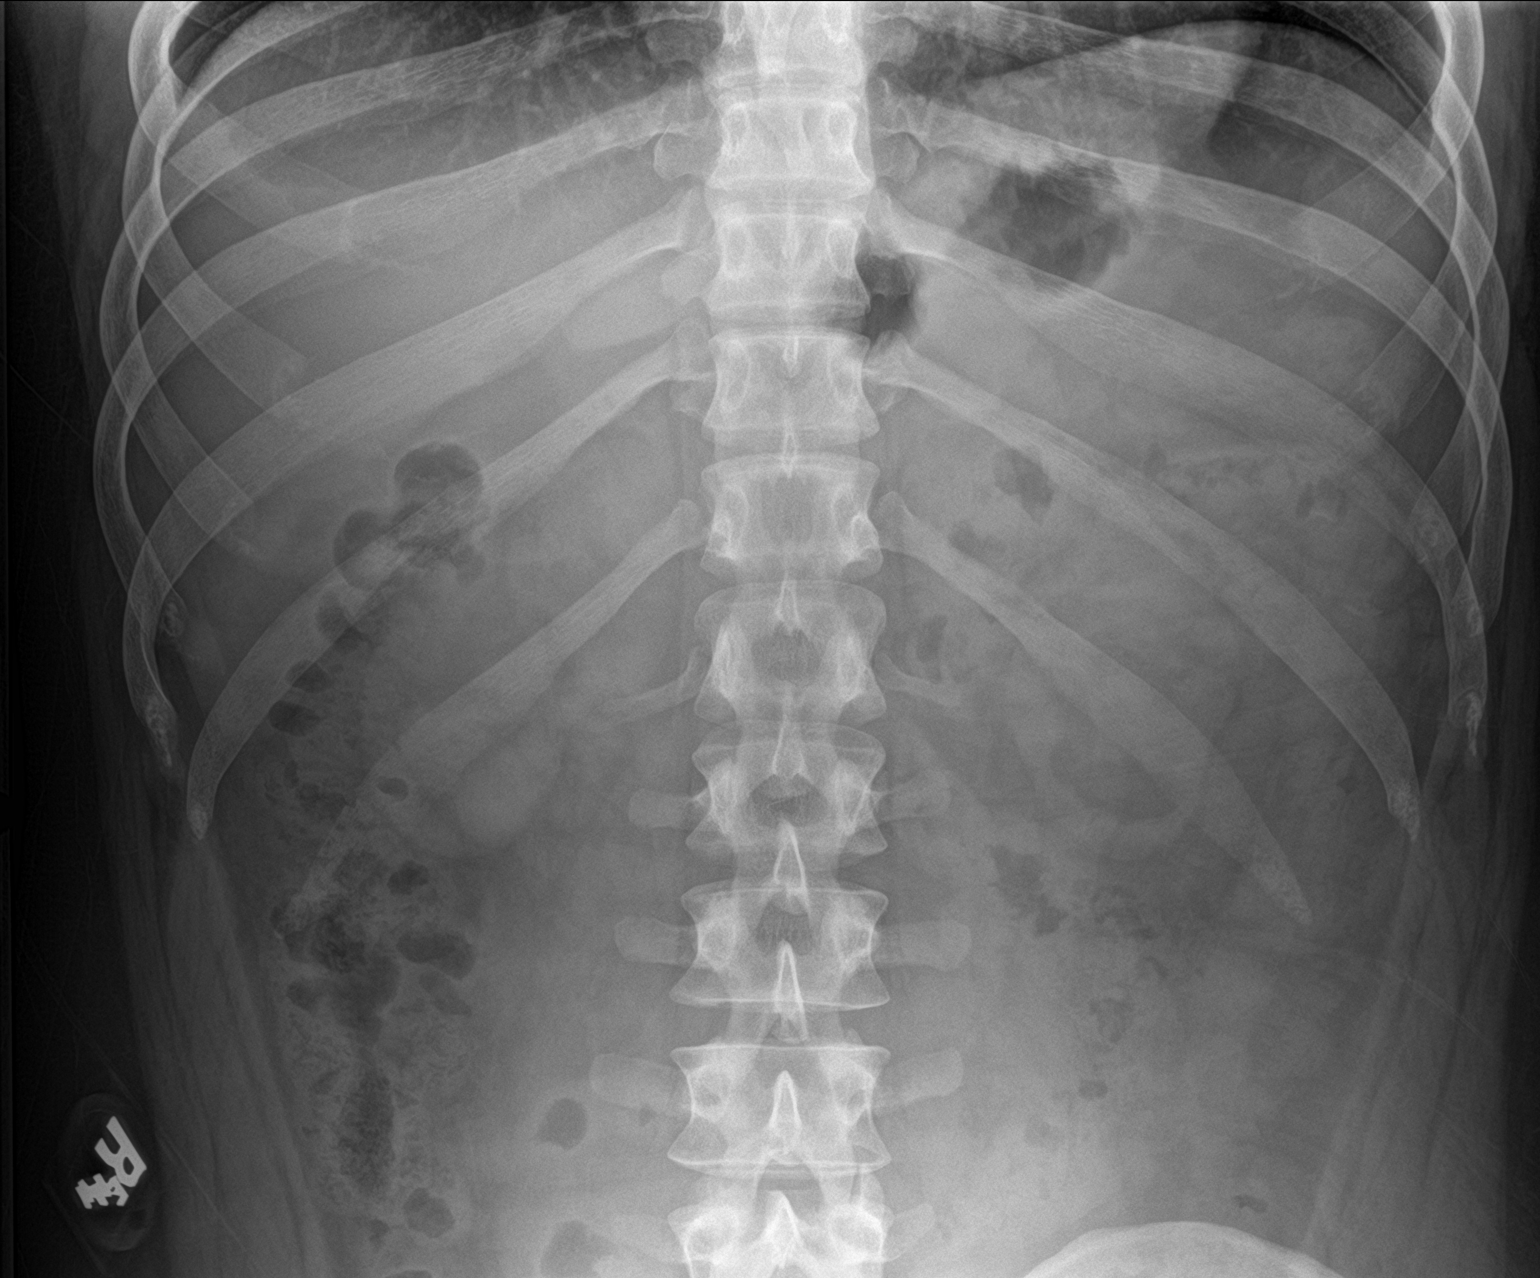

[abdomen kub (2 of 2)]
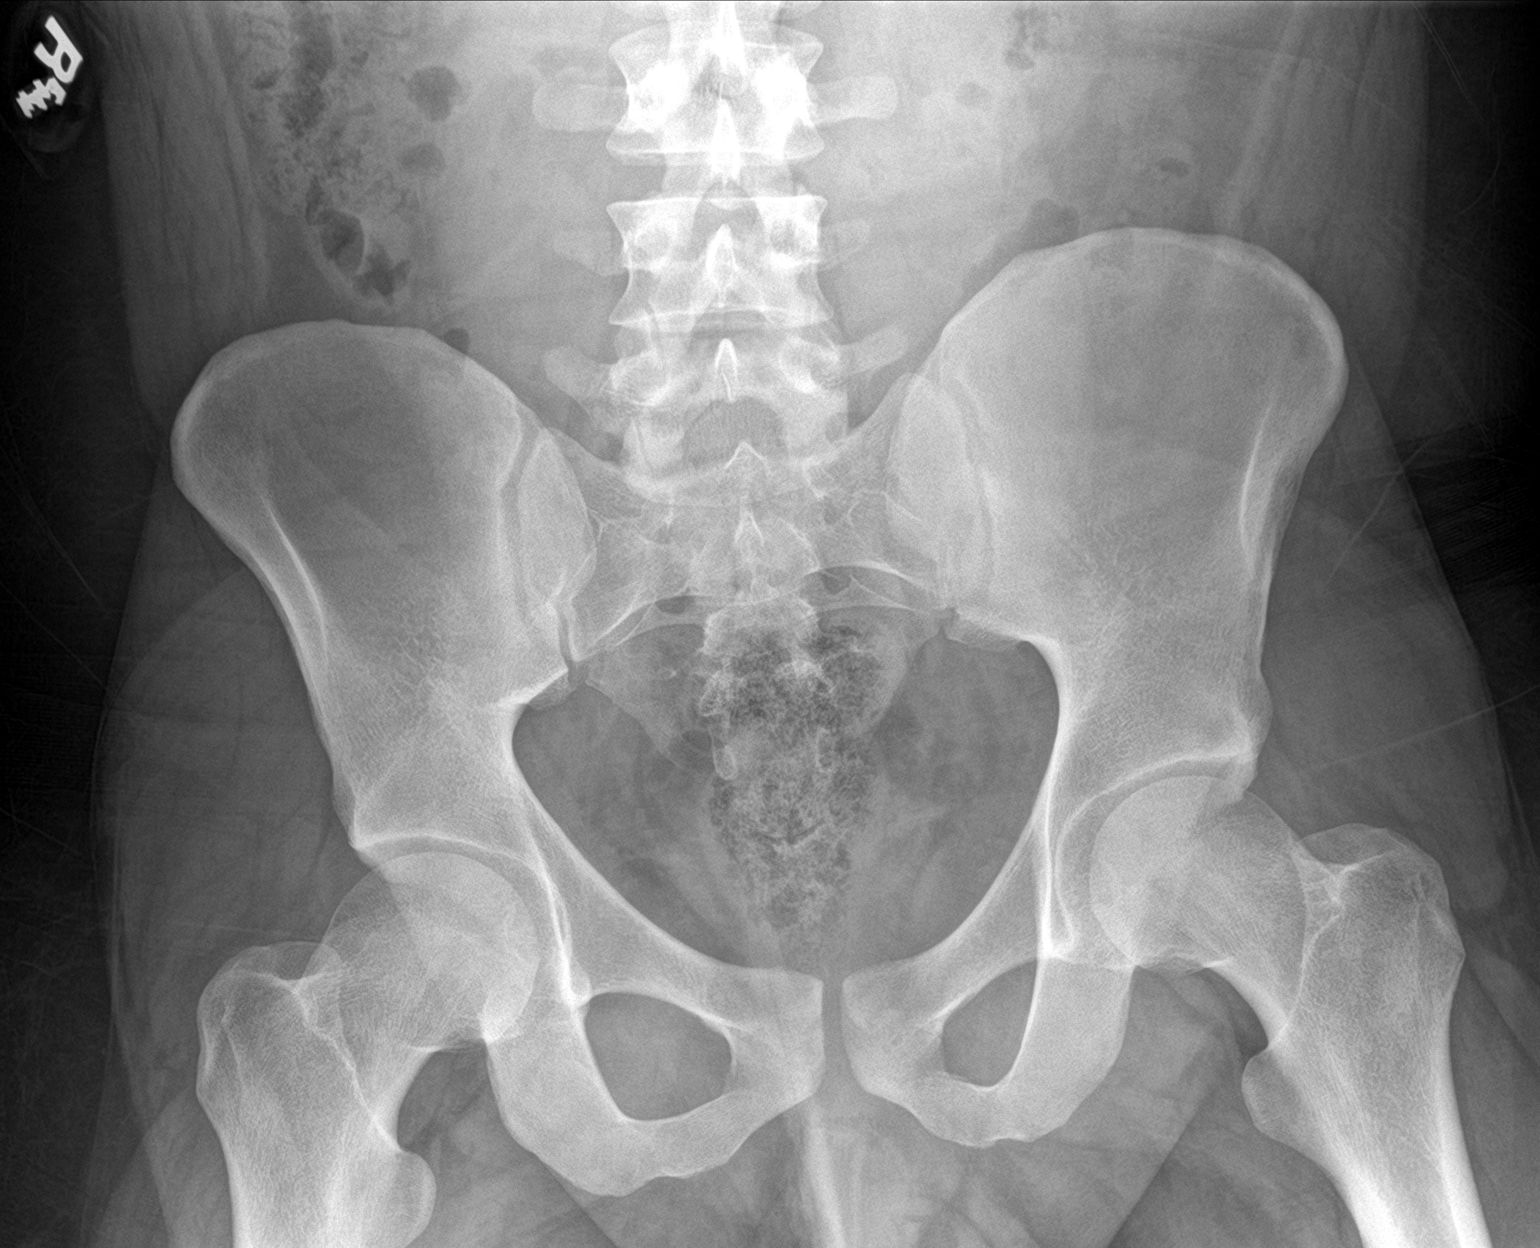

[2 of 2 positions shown; findings below may reference images not displayed]

FINDINGS: The bowel gas pattern is normal. No radio-opaque calculi or other
significant radiographic abnormality are seen.
IMPRESSION: Negative.

## 2020-01-16 IMAGING — DX DG CHEST 2V
2 series · 2 of 2 positions shown · non-contrast
Comparison: 10/25/2008

CLINICAL DATA: Productive cough with shortness of breath.

EXAM:
CHEST - 2 VIEW

[chest pa]
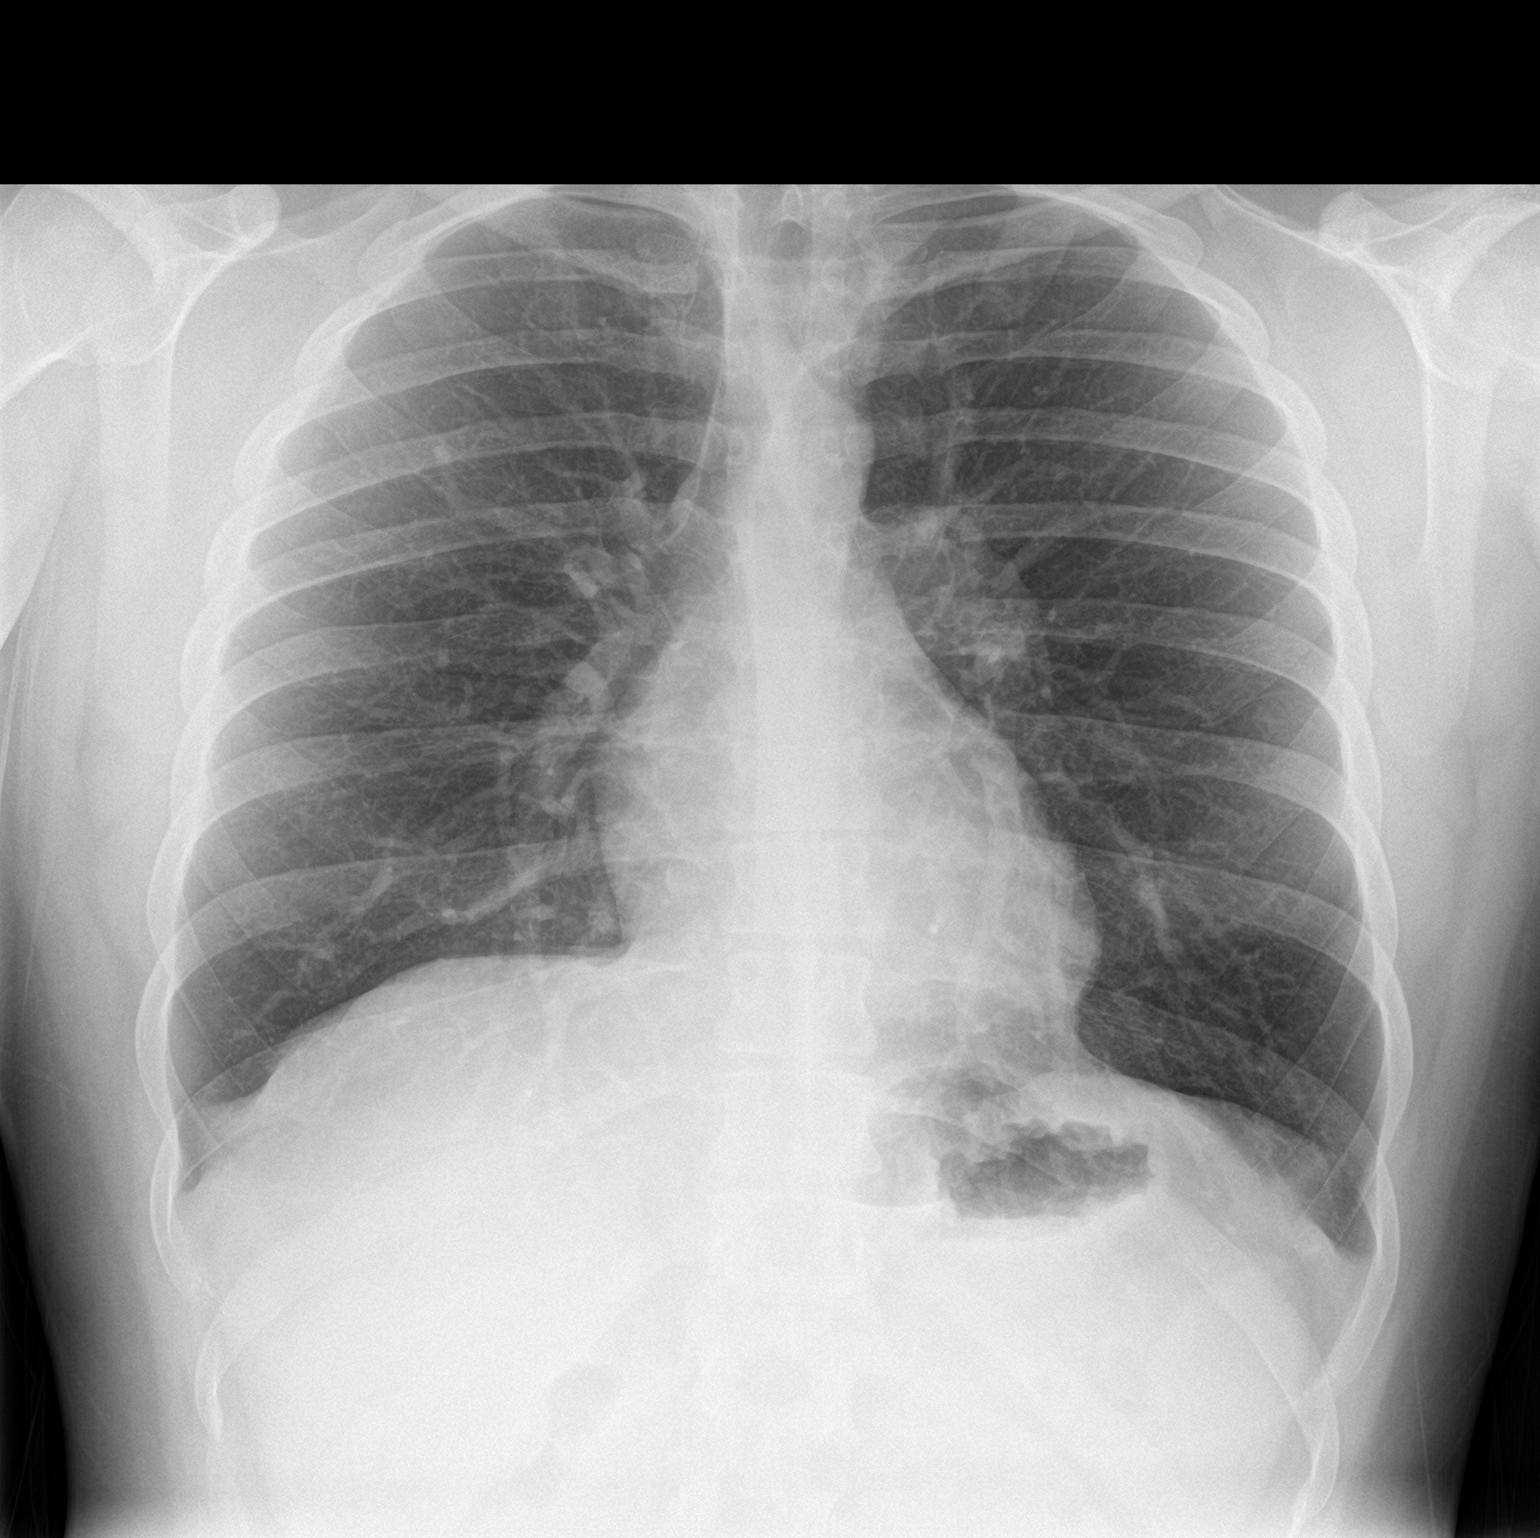

[chest lat]
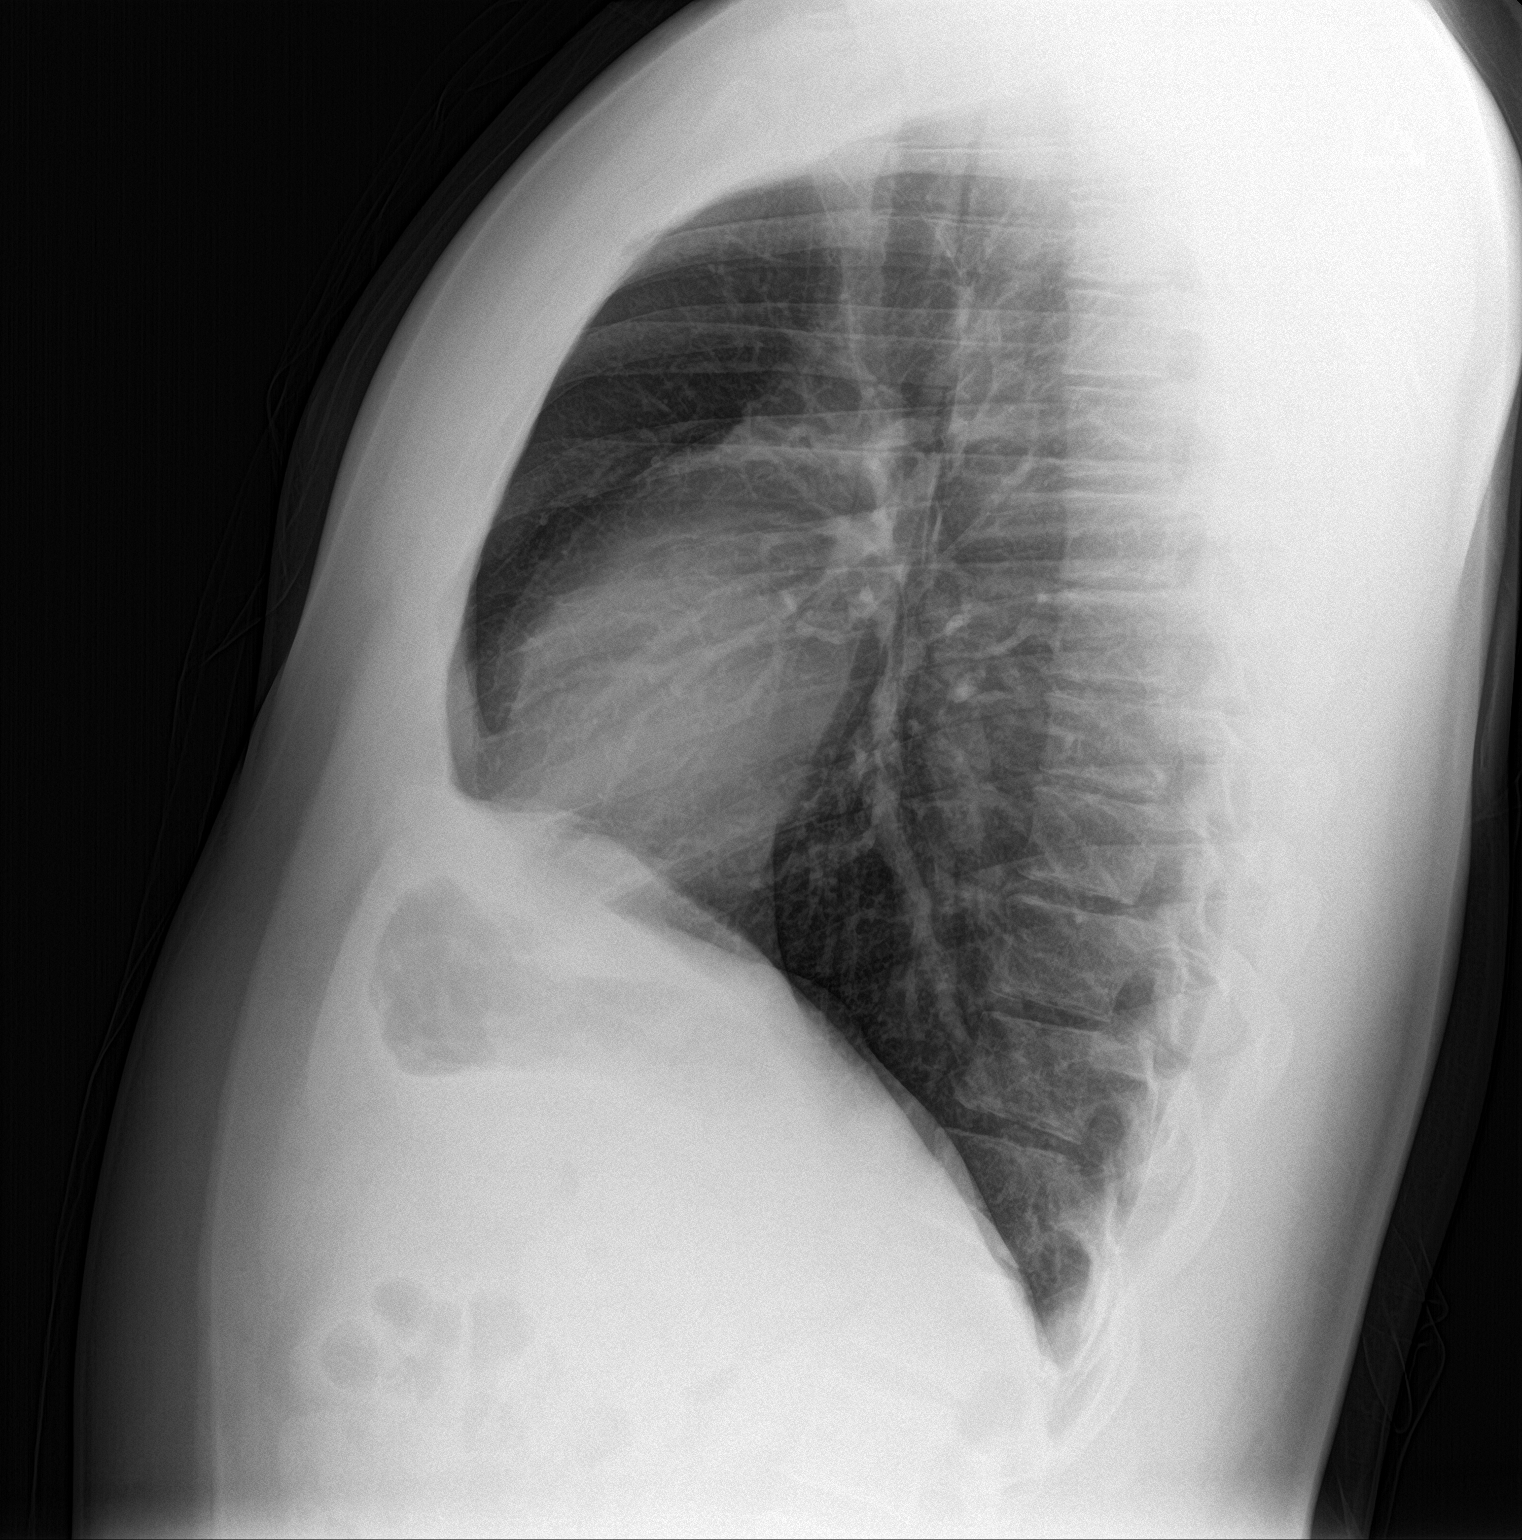

[2 of 2 positions shown; findings below may reference images not displayed]

FINDINGS: The lungs are clear without focal pneumonia, edema, pneumothorax or
pleural effusion. The cardiopericardial silhouette is within normal
limits for size. The visualized bony structures of the thorax are
intact.
IMPRESSION: Normal exam.

## 2020-09-13 ENCOUNTER — Emergency Department (INDEPENDENT_AMBULATORY_CARE_PROVIDER_SITE_OTHER)
Admission: EM | Admit: 2020-09-13 | Discharge: 2020-09-13 | Disposition: A | Discharge status: Home / Self Care | Payer: BC Managed Care – PPO | Source: Home / Self Care

## 2020-09-13 ENCOUNTER — Other Ambulatory Visit: Payer: Self-pay

## 2020-09-13 DIAGNOSIS — H6123 Impacted cerumen, bilateral: Secondary | ICD-10-CM

## 2020-09-13 DIAGNOSIS — H9202 Otalgia, left ear: Secondary | ICD-10-CM

## 2020-09-13 MED ORDER — CARBAMIDE PEROXIDE 6.5 % OT SOLN: 5.0000 [drp] | Freq: Two times a day (BID) | OTIC | 0 refills | Status: DC

## 2020-09-13 NOTE — ED Triage Notes (Signed)
Pt c/o muffled hearing sound and ringing in LT ear. Some pressure as well. Used a q tip which made it worse.

## 2020-09-13 NOTE — Discharge Instructions (Signed)
  Follow up with family medicine as needed. 

## 2020-09-13 NOTE — ED Provider Notes (Signed)
[name]    CSN: [id number] Arrival date & time: 09/13/20  1804      History   Chief Complaint Chief Complaint  Patient presents with  . Otalgia    LT    HPI Herbert Orozco is a 24 y.o. male.   HPI Herbert Orozco is a 24 y.o. male presenting to UC with c/o Left ear discomfort with muffled hearing and ringing that started earlier today. Worse after trying to use a Q-tip. No URI symptoms of cough, congestion or sore throat. No HA or dizziness.    Past Medical History:  Diagnosis Date  . Restless leg syndrome     There are no problems to display for this patient.   Past Surgical History:  Procedure Laterality Date  . HYPOSPADIAS CORRECTION         Home Medications    Prior to Admission medications   Medication Sig Start Date End Date Taking? Authorizing Provider  bisoprolol-hydrochlorothiazide (ZIAC) 10-6.25 MG tablet Take 1 tablet by mouth daily. 04/18/20  Yes [provider]  albuterol (PROVENTIL HFA;VENTOLIN HFA) 108 (90 Base) MCG/ACT inhaler Inhale 1-2 puffs into the lungs every 6 (six) hours as needed for wheezing or shortness of breath. 02/05/19   Collene Gobble, MD  beclomethasone (QVAR REDIHALER) 80 MCG/ACT inhaler Inhale 2 puffs into the lungs 2 (two) times daily. 02/05/19   Collene Gobble, MD  busPIRone (BUSPAR) 15 MG tablet  06/20/19   [provider]  carbamide peroxide (DEBROX) 6.5 % OTIC solution Place 5 drops into both ears 2 (two) times daily. 09/13/20   Lurene Shadow, PA-C  cetirizine-pseudoephedrine (ZYRTEC-D) 5-120 MG tablet Take 1 tablet by mouth 2 (two) times daily.    [provider]  escitalopram (LEXAPRO) 10 MG tablet TK 1 T PO QD 06/20/19   [provider]  esomeprazole (NEXIUM) 40 MG capsule Take one cap PO daily, 45 minutes before a meal 07/07/19   Lattie Haw, MD  famotidine (PEPCID) 20 MG tablet Take one tab PO daily at bedtime 07/07/19   Lattie Haw, MD  fluticasone (FLONASE) 50  MCG/ACT nasal spray Place into both nostrils daily.    [provider]  HYDROcodone-acetaminophen (NORCO/VICODIN) 5-325 MG per tablet Take 1 tablet by mouth every 6 (six) hours as needed for moderate pain or severe pain. 10/17/14   Gwyneth Sprout, MD  polyethylene glycol (MIRALAX / GLYCOLAX) packet Take 17 g by mouth daily. Then every other day as needed 12/30/18   Lurene Shadow, PA-C    Family History Family History  Problem Relation Age of Onset  . Hypertension Father   . Asthma Maternal Grandmother   . Depression Maternal Grandmother   . Crohn's disease Maternal Grandfather   . Hypertension Paternal Grandfather     Social History Social History   Tobacco Use  . Smoking status: Never Smoker  . Smokeless tobacco: Never Used  Vaping Use  . Vaping Use: Never used  Substance Use Topics  . Alcohol use: Yes    Comment: occ  . Drug use: No     Allergies   Sulfa antibiotics and Penicillins   Review of Systems Review of Systems  Constitutional: Negative for fever.  HENT: Positive for ear pain and hearing loss. Negative for congestion and sinus pressure.   Neurological: Negative for dizziness and headaches.     Physical Exam Triage Vital Signs ED Triage Vitals  Enc Vitals Group     BP 09/13/20  1818 (!) 145/97     Pulse Rate 09/13/20 1818 97     Resp 09/13/20 1818 18     Temp 09/13/20 1818 97.8 F (36.6 C)     Temp Source 09/13/20 1818 Oral     SpO2 09/13/20 1818 98 %     Weight --      Height --      Head Circumference --      Peak Flow --      Pain Score 09/13/20 1819 0     Pain Loc --      Pain Edu? --      Excl. in [place]? --    No data found.  Updated Vital Signs BP (!) 145/97 (BP Location: Left Arm)   Pulse 97   Temp 97.8 F (36.6 C) (Oral)   Resp 18   SpO2 98%   Visual Acuity Right Eye Distance:   Left Eye Distance:   Bilateral Distance:    Right Eye Near:   Left Eye Near:    Bilateral Near:     Physical Exam Vitals and nursing  note reviewed.  Constitutional:      General: He is not in acute distress.    Appearance: Normal appearance. He is well-developed. He is not ill-appearing, toxic-appearing or diaphoretic.  HENT:     Head: Normocephalic and atraumatic.     Right Ear: Tympanic membrane and ear canal normal. There is impacted cerumen ( partial).     Left Ear: Tympanic membrane normal. No drainage, swelling or tenderness. There is impacted cerumen.     Ears:     Comments: Left canal erythema, no edema or discharge. Cardiovascular:     Rate and Rhythm: Normal rate and regular rhythm.  Pulmonary:     Effort: Pulmonary effort is normal. No respiratory distress.     Breath sounds: Normal breath sounds.  Musculoskeletal:        General: Normal range of motion.     Cervical back: Normal range of motion.  Skin:    General: Skin is warm and dry.  Neurological:     Mental Status: He is alert and oriented to person, place, and time.  Psychiatric:        Behavior: Behavior normal.      UC Treatments / Results  Labs (all labs ordered are listed, but only abnormal results are displayed) Labs Reviewed - No data to display  EKG   Radiology No results found.  Procedures Procedures (including critical care time)  Medications Ordered in UC Medications - No data to display  Initial Impression / Assessment and Plan / UC Course  I have reviewed the triage vital signs and the nursing notes.  Pertinent labs & imaging results that were available during my care of the patient were reviewed by me and considered in my medical decision making (see chart for details).     Bilateral cerumen impaction, Left worse than right. Left ear canal: irritation c/w use of Q-tip, no edema, drainage or tenderness.  No evidence of bacterial infection at this time.  Home care instructions discussed  F/u with PCP as needed  Final Clinical Impressions(s) / UC Diagnoses   Final diagnoses:  Otalgia of left ear  Bilateral  impacted cerumen     Discharge Instructions      Follow up with family medicine as needed.    ED Prescriptions    Medication Sig Dispense Auth. Provider   carbamide peroxide (DEBROX) 6.5 % OTIC solution Place  5 drops into both ears 2 (two) times daily. 15 mL Lurene Shadow, PA-C     PDMP not reviewed this encounter.   Lurene Shadow, [place] 09/13/20 [phone]

## 2021-11-07 ENCOUNTER — Ambulatory Visit (HOSPITAL_COMMUNITY)
Admission: EM | Admit: 2021-11-07 | Discharge: 2021-11-07 | Disposition: A | Discharge status: Home / Self Care | Payer: No Typology Code available for payment source

## 2021-11-07 DIAGNOSIS — F411 Generalized anxiety disorder: Secondary | ICD-10-CM

## 2021-11-07 DIAGNOSIS — F422 Mixed obsessional thoughts and acts: Secondary | ICD-10-CM

## 2021-11-07 NOTE — ED Provider Notes (Signed)
[facility] Medical Screening Exam  Patient Name: Herbert Orozco MRN: [id number] Date of Evaluation: 11/07/21 Diagnosis:  Final diagnoses:  Anxiety state  Mixed obsessional thoughts and acts    History of Present illness: Herbert Orozco is a 25 y.o. male patient who presents to the [facility] voluntarily as a walk-in accompanied by his girlfriend and uncle with a chief complaint of feeling "very anxious."  Patient seen and evaluated face-to-face by this provider, chart reviewed and case discussed with Dr. Gasper Sells. Patient has a reported history significant of OCD, anxiety, and bipolar.  On evaluation, patient is alert and oriented x4. His thought process is logical and speech is coherent. His mood is anxious and affect is congruent. He is noted to be sitting down shaking his legs. Patient denies SI/HI/AVH. There is no evidence that the patient is responding to internal or external stimuli.   Patient states that he feels "very anxious" and has been diagnosed with OCD,  bipolar and hypochondria. He reports that he is currently prescribed Zoloft 50 mg by mouth daily, Vistaril 50 every 6 hours as needed for anxiety, and Klonopin 0.5 daily for anxiety. He reports that he started taking Zoloft 50 mg this week and was advised to increase the Zoloft to 100 mg next week. He states that he has been taking the Klonopin twice daily, more than prescribed and ran out on October 16th, the day of his surgery. He states that the next time he can get the Klonopin filled is on December 3rd. He states that he has not been able to get in contact with his psychiatrist Dr. Stephanie Coup because he works only on Thursdays and is getting ready to retire.  Patient states that he is constantly worrying that he may have eye melanoma. He states that he has followed up with the eye doctor and they have taken several pictures and advised him that it rarely happens. He  states that he has upcoming eye appointment due to concerns of a new eye freckle. He states that he had 2 cysts on his penis and had surgery in October to remove the masses. He states that even before he had surgery to his penis he was worried about having eye cancer. He states that he will sit at the computer for hours at a time researching the different types of cancer. He states that he has been obsessing about things since last September.   Patient describes his anxiety as intrusive thoughts, sweats, chills, and shaking his legs that are intense and constant. He states that he has been having severe anxiety since last September of last year. He states that talking to someone helps to distract his thoughts and helps to reduce the anxiety. He states that the thought of eye cancer makes his anxiety worse. He reports a poor appetite due to his anxiety but states that the delta 8 and 10 helps with his appetite. He reports fair sleep with using delta 8 and 10.   He states that he has been using delta 8 and delta 10  to help reduce his anxiety. He reports vaping daily and taking two or three gummies daily on average. He states that he has been using delta 8 and delta 10 since October of this year. He denies drinking alcohol or using other illicit drugs. 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   Patient states that he sees his psychiatrist Dr. Lorrene Reid via telephone. He does not  have a current therapist. He states that he has an initial appointment at [facility] this upcoming Tuesday for therapy. He states that he attended therapy once after his father passed. Patient's father passed away last 2023-06-25. Patient has had one psychiatric hospitalization in 2016 at [facility] for "depression."    Psychiatric Specialty Exam  Presentation  General Appearance:Appropriate for Environment  Eye Contact:Fair  Speech:Clear and Coherent  Speech Volume:Normal  Handedness:No data recorded  Mood and Affect   Mood:Anxious  Affect:Congruent   Thought Process  Thought Processes:Coherent  Descriptions of Associations:Intact  Orientation:Full (Time, Place and Person)  Thought Content:Logical    Hallucinations:None  Ideas of Reference:None  Suicidal Thoughts:No  Homicidal Thoughts:No   Sensorium  Memory:Immediate Fair; Recent Fair; Remote Fair  Judgment:Fair  Insight:Fair   Executive Functions  Concentration:Fair  Attention Span:Fair  [place]   Psychomotor Activity  Psychomotor Activity:Increased   Assets  Assets:Communication Skills; Desire for Improvement; Financial Resources/Insurance; Housing; Intimacy; Leisure Time; Physical Health; Social Support; Transportation; Vocational/Educational   Sleep  Sleep:Fair  Number of hours: 8   Physical Exam: Physical Exam Constitutional:      Appearance: Normal appearance.  HENT:     Head: Normocephalic.     Nose: Nose normal.  Eyes:     Conjunctiva/sclera: Conjunctivae normal.  Cardiovascular:     Rate and Rhythm: Normal rate.  Pulmonary:     Effort: Pulmonary effort is normal.  Musculoskeletal:        General: Normal range of motion.     Cervical back: Normal range of motion.  Neurological:     Mental Status: He is alert and oriented to person, place, and time.   Review of Systems  Constitutional: Negative.   HENT: Negative.    Eyes: Negative.   Respiratory: Negative.    Cardiovascular: Negative.   Gastrointestinal: Negative.   Genitourinary: Negative.   Musculoskeletal: Negative.   Skin: Negative.   Neurological: Negative.   Endo/Heme/Allergies: Negative.   Blood pressure (!) 142/94, pulse 94, temperature 97.7 F (36.5 C), temperature source Oral, resp. rate 18, SpO2 99 %. There is no height or weight on file to calculate BMI.  Musculoskeletal: Strength & Muscle Tone: within normal limits Gait & Station: normal Patient leans: N/A   [place] MSE  Discharge Disposition for Follow up and Recommendations: Based on my evaluation the patient does not appear to have an emergency medical condition and can be discharged with resources and follow up care in outpatient services for Medication Management, Individual Therapy, and Group Therapy  Recommendations:  Stop using delta 8 and delta 10 as it has been shown to be associated with psychosis and shown to increase anxiety.   Follow up with [facility]/resources provided for counseling.  Follow up with Dr. Zara Council your psychiatrist for medication management/adjustments. Take medications as prescribed and do not take more than prescribed.     Myquan Schaumburg L, NP 11/07/2021, 4:20 PM

## 2021-11-07 NOTE — Discharge Summary (Signed)
Herbert Orozco to be D/C'd Home per NP order. Discussed with the patient and all questions fully answered. An After Visit Summary was printed and given to the patient. Patient escorted out and D/C home via private auto.  [name]  11/07/2021 4:22 PM

## 2021-11-07 NOTE — Discharge Instructions (Addendum)
Discharge recommendations:  ?Patient is to take medications as prescribed. ?Please see information for follow-up appointment with psychiatry and therapy. ?Please follow up with your primary care provider for all medical related needs.  ? ?Therapy: We recommend that patient participate in individual therapy to address mental health concerns. ? ?Medications: The parent/guardian is to contact a medical professional and/or outpatient provider to address any new side effects that develop. Parent/guardian should update outpatient providers of any new medications and/or medication changes.  ? ?Safety:  ?The patient should abstain from use of illicit substances/drugs and abuse of any medications. ?If symptoms worsen or do not continue to improve or if the patient becomes actively suicidal or homicidal then it is recommended that the patient return to the closest hospital emergency department, the [facility], or call 911 for further evaluation and treatment. ?National Suicide Prevention Lifeline 1-800-SUICIDE or 1-800-273-8255. ? ?About 988 ?988 offers 24/7 access to trained crisis counselors who can help people experiencing mental health-related distress. People can call or text 988 or chat 988lifeline.org for themselves or if they are worried about a loved one who may need crisis support.  ? ?Please contact one of the following facilities to start medication management and therapy services:  ? ?[facility] ?[address]  ?[place], [place] [address] ?[phone]  ? ?[facility]  ?[address] ?[place], [place] [address] ?[phone] ? ?[facility]  ?[address], , [place] [address] ?[phone] ? ?[facility]  ?[address]  ?[place], [place] [address] ?[phone] ? ?[facility]  ?[address] ?[place], [place] [address] ?[phone] ? ?[facility]  ?[address],  ?,  [address] ?[phone] ? ?  ?

## 2021-11-07 NOTE — Progress Notes (Signed)
Patient presents to the [facility] with his uncle and his girlfriend seeking help for his anxiety.  Patient states that he has been experiencing panic attacks.  He states that he is also diagnosed with OCD and Hypo-Chondriasis and states that he becomes convinced that he has cancer in his eyes and he becomes very focued on this and states that he will sit at his computer for up to eight hours at a time researching the different types of cancer.  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  Patient recently has penile surgery to have some cysts removed and he was convinced that he had cancer then as well.  Patient states that e was taking Xanax .5 mg for four years off and on, but states that his psychiatrist, Stephanie Coup recently changed it to 1/2 mg Klonopin bid after patient self reported that he was taking the Xanax nore than prescribed.  He also added Zoloft 50 mg daily and he is support to increase the Zoloft to 100 mg daily next week.  Patient is also diagnosed with Bipolar Disorder and has manic episodes where he spends too much money buying things that he states that he does not need.  Patient is concerned that an SSRI could trigger his mania. Patient states that he also has a diagnosis od PTSD stemming from witnessing his father have a brain aneurysm and becoming brain dead to the point that he and his mother had to make the decison to cease all life saving measures the next day,  Patient has never fully grieved this loss which is also contributing to his anxiety.  Patient denies SI/HI/Psychosis, but states that he does have intrusive thoughts that can cycle for hours.  He states that he has been using Delta 8 and 10 to help with his anxiety and to help him sleep and states that he has been sleeping for eight hours per night.  He states that his appetite fluctuated and he experiences weight gain and loss of 50-75 pounds at a time.  Patient does not meet admission criteria and is routine.

## 2021-11-09 ENCOUNTER — Telehealth (HOSPITAL_COMMUNITY): Payer: Self-pay | Admitting: Family Medicine

## 2021-11-09 NOTE — BH Assessment (Signed)
Care Management - Follow Up Discharges   Writer attempted to make contact with patient today and was unsuccessful.  Writer left a HIPPA compliant voice message.   Per chart review, patient will follow up with his established provider Dr. Ignacia Palma.

## 2021-11-12 ENCOUNTER — Encounter: Payer: Self-pay | Admitting: Emergency Medicine

## 2021-11-12 ENCOUNTER — Other Ambulatory Visit: Payer: Self-pay

## 2021-11-12 ENCOUNTER — Emergency Department (INDEPENDENT_AMBULATORY_CARE_PROVIDER_SITE_OTHER): Payer: Self-pay

## 2021-11-12 ENCOUNTER — Emergency Department
Admission: EM | Admit: 2021-11-12 | Discharge: 2021-11-12 | Disposition: A | Discharge status: Home / Self Care | Payer: Self-pay | Source: Home / Self Care

## 2021-11-12 DIAGNOSIS — M79671 Pain in right foot: Secondary | ICD-10-CM

## 2021-11-12 NOTE — Discharge Instructions (Addendum)
Advised patient to follow-up with [facility] (contact information provided above) for further evaluation and possible advanced imaging.  Advised patient to call this group today to schedule appointment for further evaluation of right foot pain.

## 2021-11-12 NOTE — ED Triage Notes (Signed)
Pain to right foot on the inside towards the back  Pt can not recall when it first started  Pain increases when pt pushes on the sore area  Denies injury  No change in pain level with different types of shoes No OTC meds  Pt asked bout an MRI- RN explained we could do an Xray at this time

## 2021-11-12 NOTE — ED Provider Notes (Signed)
[name]    CSN: [id number] Arrival date & time: 11/12/21  5374      History   Chief Complaint Chief Complaint  Patient presents with   Foot Pain    right    HPI Herbert Orozco is a 25 y.o. male.   HPI patient presents with right foot pain for 3 weeks.  He denies injury or insult to right foot.  Patient reports pain is just below medial malleolus and feels like painful tender knot.  PMH significant for restless leg syndrome.   Past Medical History:  Diagnosis Date   Restless leg syndrome     There are no problems to display for this patient.   Past Surgical History:  Procedure Laterality Date   CYST EXCISION     HYPOSPADIAS CORRECTION         Home Medications    Prior to Admission medications   Medication Sig Start Date End Date Taking? Authorizing Provider  hydrocortisone 2.5 % cream Apply topically. 10/18/21  Yes [provider]  hydrOXYzine (ATARAX) 50 MG tablet 1 po q 6 hours prn for anxiety 10/14/21  Yes [provider]  mirtazapine (REMERON) 15 MG tablet  11/11/21  Yes [provider]  sertraline (ZOLOFT) 100 MG tablet TK 1 T PO QD 07/06/18  Yes [provider]  albuterol (PROVENTIL HFA;VENTOLIN HFA) 108 (90 Base) MCG/ACT inhaler Inhale 1-2 puffs into the lungs every 6 (six) hours as needed for wheezing or shortness of breath. Patient not taking: Reported on 11/12/2021 02/05/19   Collene Gobble, MD  beclomethasone (QVAR REDIHALER) 80 MCG/ACT inhaler Inhale 2 puffs into the lungs 2 (two) times daily. Patient not taking: Reported on 11/12/2021 02/05/19   Collene Gobble, MD  cetirizine-pseudoephedrine (ZYRTEC-D) 5-120 MG tablet Take 1 tablet by mouth 2 (two) times daily. Patient not taking: Reported on 11/12/2021    [provider]  diazepam (VALIUM) 10 MG tablet Take 10 mg by mouth daily as needed. 06/10/21   [provider]  esomeprazole (NEXIUM) 40 MG capsule Take one cap PO daily, 45  minutes before a meal Patient not taking: Reported on 11/12/2021 07/07/19   Lattie Haw, MD  famotidine (PEPCID) 20 MG tablet Take one tab PO daily at bedtime Patient not taking: Reported on 11/12/2021 07/07/19   Lattie Haw, MD  fluticasone [facility]) 50 MCG/ACT nasal spray Place into both nostrils daily. Patient not taking: Reported on 11/12/2021    [provider]    Family History Family History  Problem Relation Age of Onset   Healthy Mother    Hypertension Father    Asthma Maternal Grandmother    Depression Maternal Grandmother    Crohn's disease Maternal Grandfather    Hypertension Paternal Grandfather     Social History Social History   Tobacco Use   Smoking status: Never    Passive exposure: Never   Smokeless tobacco: Never  Vaping Use   Vaping Use: Never used  Substance Use Topics   Alcohol use: Yes    Comment: occ   Drug use: No     Allergies   Penicillins and Sulfa antibiotics   Review of Systems Review of Systems  Musculoskeletal:        Right foot pain x3 weeks.    Physical Exam Triage Vital Signs ED Triage Vitals  Enc Vitals Group     BP 11/12/21 1047 (!) 126/92     Pulse Rate 11/12/21 1047 96  Resp 11/12/21 1047 18     Temp 11/12/21 1047 99.5 F (37.5 C)     Temp Source 11/12/21 1047 Oral     SpO2 11/12/21 1047 96 %     Weight 11/12/21 1049 230 lb (104.3 kg)     Height 11/12/21 1049 5\' 9"  (1.753 m)     Head Circumference --      Peak Flow --      Pain Score 11/12/21 1049 3     Pain Loc --      Pain Edu? --      Excl. in [place]? --    No data found.  Updated Vital Signs BP (!) 126/92 (BP Location: Left Arm)   Pulse 96   Temp 99.5 F (37.5 C) (Oral)   Resp 18   Ht 5\' 9"  (1.753 m)   Wt 230 lb (104.3 kg)   SpO2 96%   BMI 33.97 kg/m    Physical Exam Vitals and nursing note reviewed.  Constitutional:      General: He is not in acute distress.    Appearance: Normal appearance. He is obese. He is not  ill-appearing.  HENT:     Head: Normocephalic and atraumatic.     Mouth/Throat:     Mouth: Mucous membranes are moist.     Pharynx: Oropharynx is clear.  Eyes:     Extraocular Movements: Extraocular movements intact.     Conjunctiva/sclera: Conjunctivae normal.     Pupils: Pupils are equal, round, and reactive to light.  Cardiovascular:     Rate and Rhythm: Normal rate and regular rhythm.     Pulses: Normal pulses.     Heart sounds: Normal heart sounds.  Pulmonary:     Effort: Pulmonary effort is normal.     Breath sounds: Normal breath sounds.  Musculoskeletal:     Comments: Right foot: TTP just inferior to medial malleolus, patient concerned about possible mass/or other  Skin:    General: Skin is warm and dry.  Neurological:     General: No focal deficit present.     Mental Status: He is alert and oriented to person, place, and time.     UC Treatments / Results  Labs (all labs ordered are listed, but only abnormal results are displayed) Labs Reviewed - No data to display  EKG   Radiology DG Foot Complete Right  Result Date: 11/12/2021 CLINICAL DATA:  25 year-old male c/o painful "knot" on medial calcaneus x months. NKI. pain to inside of right foot - denies injury EXAM: RIGHT FOOT COMPLETE - 3+ VIEW COMPARISON:  None. FINDINGS: No fracture or dislocation of mid foot or forefoot. The phalanges are normal. The calcaneus is normal. No soft tissue abnormality. IMPRESSION: No acute osseous abnormality. Electronically Signed   By: Suzy Bouchard M.D.   On: 11/12/2021 11:21    Procedures Procedures (including critical care time)  Medications Ordered in UC Medications - No data to display  Initial Impression / Assessment and Plan / UC Course  I have reviewed the triage vital signs and the nursing notes.  Pertinent labs & imaging results that were available during my care of the patient were reviewed by me and considered in my medical decision making (see chart for  details).     MDM: 1. Right foot pain right foot x-ray revealed no acute osseous abnormality.  Advised/patient to follow-up with [facility] pod[facility]ry.  Contact information provided with AVS. Advised patient to follow-up with  Podiatry (contact information  provided above) for further evaluation and possible advanced imaging.  Advised patient to call this group today to schedule appointment for further evaluation of right foot pain.  Patient discharged home, hemodynamically stable. Final Clinical Impressions(s) / UC Diagnoses   Final diagnoses:  Foot pain, right     Discharge Instructions      Advised patient to follow-up with [facility] (contact information provided above) for further evaluation and possible advanced imaging.  Advised patient to call this group today to schedule appointment for further evaluation of right foot pain.     ED Prescriptions   None    PDMP not reviewed this encounter.   Eliezer Lofts, [place] 11/12/21 1230
